# Patient Record
Sex: Male | Born: 1985 | Race: White | Hispanic: No | Marital: Married | Smoking: Never smoker
Health system: Southern US, Community
[De-identification: ages and names within clinical notes are randomized; demographics above are authoritative.]

---

## 2019-12-22 ENCOUNTER — Other Ambulatory Visit: Payer: Self-pay

## 2019-12-22 ENCOUNTER — Encounter: Payer: Self-pay | Admitting: *Deleted

## 2019-12-22 DIAGNOSIS — N200 Calculus of kidney: Secondary | ICD-10-CM | POA: Insufficient documentation

## 2019-12-22 DIAGNOSIS — N23 Unspecified renal colic: Secondary | ICD-10-CM | POA: Diagnosis not present

## 2019-12-22 DIAGNOSIS — R109 Unspecified abdominal pain: Secondary | ICD-10-CM | POA: Diagnosis present

## 2019-12-22 NOTE — ED Triage Notes (Signed)
Pt to ED reporting chronic lower back pain that haw started to worsen over the past thirty minutes. Pt reports the pain is different than normal and is localized to the right flank with dysuria.   No hx of kidney stones.

## 2019-12-23 ENCOUNTER — Emergency Department: Payer: BC Managed Care – PPO

## 2019-12-23 ENCOUNTER — Emergency Department
Admission: EM | Admit: 2019-12-23 | Discharge: 2019-12-23 | Disposition: A | Discharge status: Home / Self Care | Payer: BC Managed Care – PPO | Attending: Emergency Medicine | Admitting: Emergency Medicine

## 2019-12-23 DIAGNOSIS — N200 Calculus of kidney: Secondary | ICD-10-CM

## 2019-12-23 DIAGNOSIS — N23 Unspecified renal colic: Secondary | ICD-10-CM

## 2019-12-23 LAB — CBC WITH DIFFERENTIAL/PLATELET
Abs Immature Granulocytes: 0.1 10*3/uL — ABNORMAL HIGH (ref 0.00–0.07)
Basophils Absolute: 0.1 10*3/uL (ref 0.0–0.1)
Basophils Relative: 0 %
Eosinophils Absolute: 0.1 10*3/uL (ref 0.0–0.5)
Eosinophils Relative: 1 %
HCT: 46.6 % (ref 39.0–52.0)
Hemoglobin: 15.9 g/dL (ref 13.0–17.0)
Immature Granulocytes: 1 %
Lymphocytes Relative: 15 %
Lymphs Abs: 2.4 10*3/uL (ref 0.7–4.0)
MCH: 29.7 pg (ref 26.0–34.0)
MCHC: 34.1 g/dL (ref 30.0–36.0)
MCV: 87.1 fL (ref 80.0–100.0)
Monocytes Absolute: 1.1 10*3/uL — ABNORMAL HIGH (ref 0.1–1.0)
Monocytes Relative: 7 %
Neutro Abs: 12.4 10*3/uL — ABNORMAL HIGH (ref 1.7–7.7)
Neutrophils Relative %: 76 %
Platelets: 311 10*3/uL (ref 150–400)
RBC: 5.35 MIL/uL (ref 4.22–5.81)
RDW: 11.9 % (ref 11.5–15.5)
WBC: 16.2 10*3/uL — ABNORMAL HIGH (ref 4.0–10.5)
nRBC: 0 % (ref 0.0–0.2)

## 2019-12-23 LAB — URINALYSIS, COMPLETE (UACMP) WITH MICROSCOPIC
Bacteria, UA: NONE SEEN
Bilirubin Urine: NEGATIVE
Glucose, UA: NEGATIVE mg/dL
Ketones, ur: NEGATIVE mg/dL
Leukocytes,Ua: NEGATIVE
Nitrite: NEGATIVE
Protein, ur: 30 mg/dL — AB
RBC / HPF: >50 RBC/hpf — ABNORMAL HIGH (ref 0–5)
Specific Gravity, Urine: 1.024 (ref 1.005–1.030)
pH: 5 (ref 5.0–8.0)

## 2019-12-23 LAB — LIPASE, BLOOD: Lipase: 40 U/L (ref 11–51)

## 2019-12-23 LAB — COMPREHENSIVE METABOLIC PANEL
ALT: 33 U/L (ref 0–44)
AST: 26 U/L (ref 15–41)
Albumin: 4.3 g/dL (ref 3.5–5.0)
Alkaline Phosphatase: 66 U/L (ref 38–126)
Anion gap: 8 (ref 5–15)
BUN: 19 mg/dL (ref 6–20)
CO2: 25 mmol/L (ref 22–32)
Calcium: 9.2 mg/dL (ref 8.9–10.3)
Chloride: 105 mmol/L (ref 98–111)
Creatinine, Ser: 0.81 mg/dL (ref 0.61–1.24)
GFR calc Af Amer: >60 mL/min (ref >60)
GFR calc non Af Amer: >60 mL/min (ref >60)
Glucose, Bld: 102 mg/dL — ABNORMAL HIGH (ref 70–99)
Potassium: 4.8 mmol/L (ref 3.5–5.1)
Sodium: 138 mmol/L (ref 135–145)
Total Bilirubin: 1.4 mg/dL — ABNORMAL HIGH (ref 0.3–1.2)
Total Protein: 7.5 g/dL (ref 6.5–8.1)

## 2019-12-23 MED ORDER — ONDANSETRON HCL 4 MG/2ML IJ SOLN
4.0000 mg | Freq: Once | INTRAMUSCULAR | Status: AC
  Administered 2019-12-23: 02:00:00 4 mg via INTRAVENOUS
  Filled 2019-12-23: qty 2

## 2019-12-23 MED ORDER — KETOROLAC TROMETHAMINE 30 MG/ML IJ SOLN
15.0000 mg | Freq: Once | INTRAMUSCULAR | Status: AC
  Administered 2019-12-23: 04:00:00 15 mg via INTRAVENOUS
  Filled 2019-12-23: qty 1

## 2019-12-23 MED ORDER — SODIUM CHLORIDE 0.9 % IV BOLUS: 1000.0000 mL | Freq: Once | INTRAVENOUS | Status: DC

## 2019-12-23 NOTE — ED Provider Notes (Signed)
[facility] Emergency Department Provider Note  ____________________________________________  Time seen: Approximately 1:49 AM  I have reviewed the triage vital signs and the nursing notes.   HISTORY  Chief Complaint Flank Pain   HPI Manuel Howard is a 34 y.o. male with a history of ankylosing spondylitis, sacroiliitis, wedge compression fracture of T7 and T8 who presents for evaluation of flank pain.  Patient reports that he started having a flare of his AS a couple of days ago.  Has been taking his daily meloxicam as prescribed.   This evening around 9 PM started having some urinary urgency and dysuria.  About an hour later he started having right-sided flank pain.  Initially dull but then it became severe and sharp radiating to the right groin and abdominal area associated with nausea.  The pain was severe.  Patient reports the pain is very different from his exacerbations of AS.  At this time he reports that the pain has significantly improved but still present mildly.  He reports that he might have seen some blood in his urine.  He denies prior history of kidney stones.  He denies testicular pain or swelling.  He denies any prior abdominal surgeries.  No diarrhea or constipation, no chest pain or shortness of breath.  PMH Ankylosing spondylitis Sacroiliitis Compression fracture T7-T8  Allergies Diclofenac and Amoxicillin  FH Father -ankylosing spondylitis and kidney stones  Social History Social History   Tobacco Use  . Smoking status: Never Smoker  . Smokeless tobacco: Never Used  Substance Use Topics  . Alcohol use: Never  . Drug use: Never    Review of Systems  Constitutional: Negative for fever. Eyes: Negative for visual changes. ENT: Negative for sore throat. Neck: No neck pain  Cardiovascular: Negative for chest pain. Respiratory: Negative for shortness of breath. Gastrointestinal: Negative for abdominal pain, vomiting or  diarrhea. Genitourinary: + dysuria, frequency, hematuria, and R flank pain Musculoskeletal: Negative for back pain. Skin: Negative for rash. Neurological: Negative for headaches, weakness or numbness. Psych: No SI or HI  ____________________________________________   PHYSICAL EXAM:  VITAL SIGNS: ED Triage Vitals  Enc Vitals Group     BP 12/22/19 2347 (!) 147/103     Pulse Rate 12/22/19 2347 96     Resp 12/22/19 2347 16     Temp 12/22/19 2347 97.9 F (36.6 C)     Temp Source 12/22/19 2347 Oral     SpO2 12/22/19 2347 98 %     Weight 12/22/19 2348 239 lb (108.4 kg)     Height 12/22/19 2348 5\' 10"  (1.778 m)     Head Circumference --      Peak Flow --      Pain Score 12/22/19 2347 8     Pain Loc --      Pain Edu? --      Excl. in GC? --     Constitutional: Alert and oriented. Well appearing and in no apparent distress. HEENT:      Head: Normocephalic and atraumatic.         Eyes: Conjunctivae are normal. Sclera is non-icteric.       Mouth/Throat: Mucous membranes are moist.       Neck: Supple with no signs of meningismus. Cardiovascular: Regular rate and rhythm. No murmurs, gallops, or rubs.  Respiratory: Normal respiratory effort. Lungs are clear to auscultation bilaterally. Gastrointestinal: Soft, non tender, and non distended with positive bowel sounds. No rebound or guarding. Genitourinary: No CVA tenderness. Bilateral  testicles are descended with no tenderness to palpation, bilateral positive cremasteric reflexes are present, no swelling or erythema of the scrotum. No evidence of inguinal hernia. Musculoskeletal: No edema, cyanosis, or erythema of extremities. Neurologic: Normal speech and language. Face is symmetric. Moving all extremities. No gross focal neurologic deficits are appreciated. Skin: Skin is warm, dry and intact. No rash noted. Psychiatric: Mood and affect are normal. Speech and behavior are normal.  ____________________________________________    LABS (all labs ordered are listed, but only abnormal results are displayed)  Labs Reviewed  CBC WITH DIFFERENTIAL/PLATELET - Abnormal; Notable for the following components:      Result Value   WBC 16.2 (*)    Neutro Abs 12.4 (*)    Monocytes Absolute 1.1 (*)    Abs Immature Granulocytes 0.10 (*)    All other components within normal limits  COMPREHENSIVE METABOLIC PANEL - Abnormal; Notable for the following components:   Glucose, Bld 102 (*)    Total Bilirubin 1.4 (*)    All other components within normal limits  URINALYSIS, COMPLETE (UACMP) WITH MICROSCOPIC - Abnormal; Notable for the following components:   Color, Urine YELLOW (*)    APPearance HAZY (*)    Hgb urine dipstick LARGE (*)    Protein, ur 30 (*)    RBC / HPF >50 (*)    All other components within normal limits  LIPASE, BLOOD   ____________________________________________  EKG  none  ____________________________________________  RADIOLOGY  I have personally reviewed the images performed during this visit and I agree with the Radiologist's read.   Interpretation by Radiologist:  CT Renal Stone Study  Result Date: 12/23/2019 CLINICAL DATA:  34 year old male with flank pain. Concern for kidney stone. EXAM: CT ABDOMEN AND PELVIS WITHOUT CONTRAST TECHNIQUE: Multidetector CT imaging of the abdomen and pelvis was performed following the standard protocol without IV contrast. COMPARISON:  None. FINDINGS: Evaluation of this exam is limited in the absence of intravenous contrast. Lower chest: The visualized lung bases are clear. No intra-abdominal free air or free fluid. Hepatobiliary: Diffuse fatty infiltration of the liver. No intrahepatic biliary ductal dilatation. The gallbladder is unremarkable. Pancreas: Unremarkable. No pancreatic ductal dilatation or surrounding inflammatory changes. Spleen: Normal in size without focal abnormality. Adrenals/Urinary Tract: The adrenal glands are unremarkable. There is a 3 mm  nonobstructing right renal inferior pole calculus. Several punctate nonobstructing left renal calculi also noted. There is no hydronephrosis on either side. Stomach/Bowel: There is no bowel obstruction or active inflammation. The appendix is normal. Vascular/Lymphatic: The aorta and IVC are grossly unremarkable on this noncontrast CT. No portal venous gas. There is no adenopathy. There is mild haziness of the mesentery with multiple top-normal lymph nodes with a "misty mesentery" appearance. This finding is nonspecific but may be related to underlying inflammatory/infectious etiology. Reproductive: The prostate and seminal vesicles are grossly unremarkable. No pelvic mass. Other: None Musculoskeletal: No acute or significant osseous findings. IMPRESSION: 1. Small nonobstructing bilateral renal calculi. No hydronephrosis. 2. Fatty liver. 3. No bowel obstruction. Normal appendix. Electronically Signed   By: Elgie Collard M.D.   On: 12/23/2019 02:54      ____________________________________________   PROCEDURES  Procedure(s) performed: None Procedures Critical Care performed:  None ____________________________________________   INITIAL IMPRESSION / ASSESSMENT AND PLAN / ED COURSE   34 y.o. male with a history of ankylosing spondylitis, sacroiliitis, wedge compression fracture of T7 and T8 who presents for evaluation of flank pain, frequency, dysuria, hematuria.  Patient is well-appearing in no distress  with normal vital signs, no flank tenderness, abdominal exam shows no tenderness, no distention, GU exam is normal.  Differential diagnoses including kidney stones versus pyelonephritis versus UTI versus STD versus AS flair versus appendicitis versus GB disease.  Plan for CBC, CMP, UA, CT renal.  _________________________ 5:03 AM on 12/23/2019 -----------------------------------------  CT showing no obstructing stone at this time but bilateral renal calculi, UA is positive for hematuria with  no evidence of urinary tract infection.  These findings are mostly consistent with a recent past stone.  Discussed dietary changes and increase p.o. hydration to prevent any further kidney stones.  Labs showing leukocytosis with white count of 16.2 most likely stress-induced.  CMP with no significant electrolyte abnormalities.  Patient is stable for discharge home.  Recommended continuing meloxicam which he takes for his ankylosing spondylitis.  Discussed my standard return precautions and follow-up with his doctor.   As part of my medical decision making, I  have reviewed patient's previous medical records and PMH. Patient received IV toradol for pain.       _____________________________________________ Please note:  Patient was evaluated in Emergency Department today for the symptoms described in the history of present illness. Patient was evaluated in the context of the global COVID-19 pandemic, which necessitated consideration that the patient might be at risk for infection with the SARS-CoV-2 virus that causes COVID-19. Institutional protocols and algorithms that pertain to the evaluation of patients at risk for COVID-19 are in a state of rapid change based on information released by regulatory bodies including the CDC and federal and state organizations. These policies and algorithms were followed during the patient's care in the ED.  Some ED evaluations and interventions may be delayed as a result of limited staffing during the pandemic.   ____________________________________________   FINAL CLINICAL IMPRESSION(S) / ED DIAGNOSES   Final diagnoses:  Ureteral colic  Kidney stone      NEW MEDICATIONS STARTED DURING THIS VISIT:  ED Discharge Orders    None       Note:  This document was prepared using Dragon voice recognition software and may include unintentional dictation errors.    Alfred Levins, [place], MD 12/23/19 [phone]

## 2020-08-30 IMAGING — CT CT RENAL STONE PROTOCOL
2 of 4 series · 16 of 46 positions shown, 18 images · non-contrast
Comparison: None.

CLINICAL DATA: 33-year-old male with flank pain. Concern for kidney
stone.

EXAM:
CT ABDOMEN AND PELVIS WITHOUT CONTRAST
TECHNIQUE: Multidetector CT imaging of the abdomen and pelvis was performed
following the standard protocol without IV contrast.

[Series 2: stone full standard · axial · 0.80mm/px · z∈[-914,-424]mm · 13 of 108 slices shown, 15 images]
[im 5/108  soft-tissue]
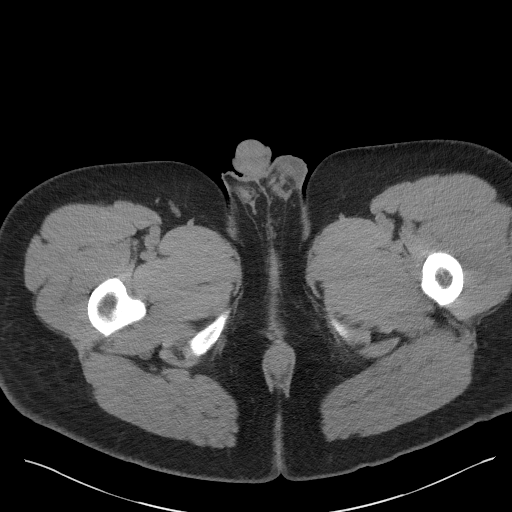
[im 5/108  bone]
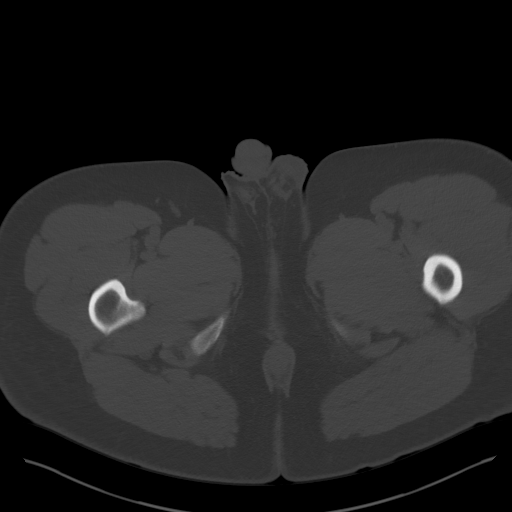
[im 13/108  soft-tissue]
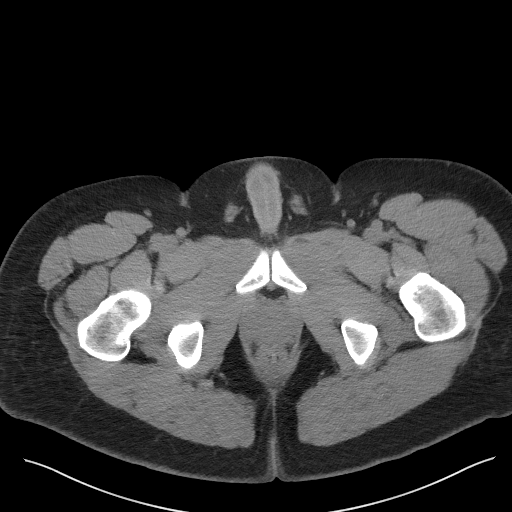
[im 22/108  soft-tissue]
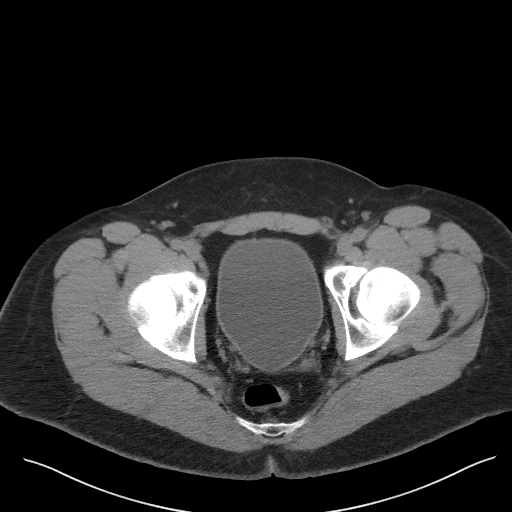
[im 30/108  soft-tissue]
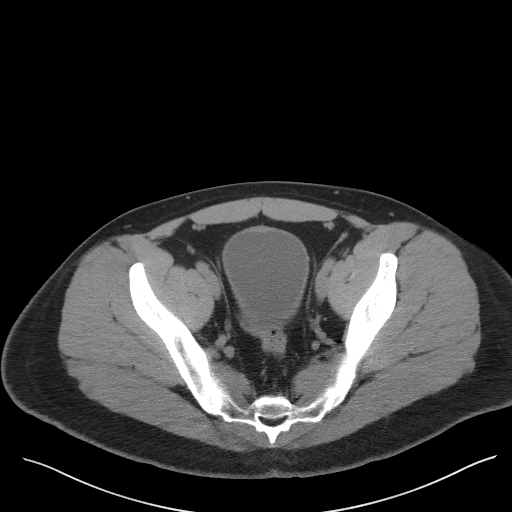
[im 39/108  soft-tissue]
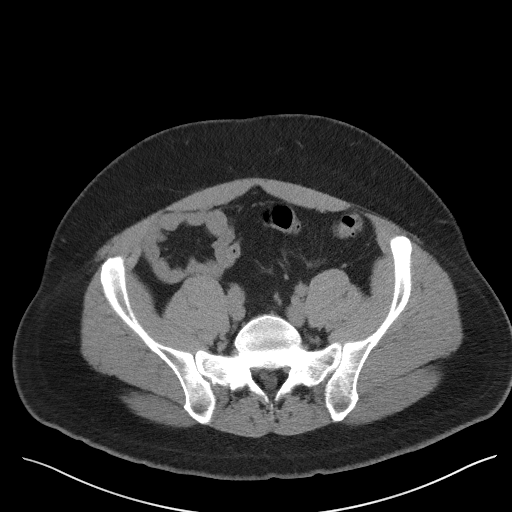
[im 48/108  soft-tissue]
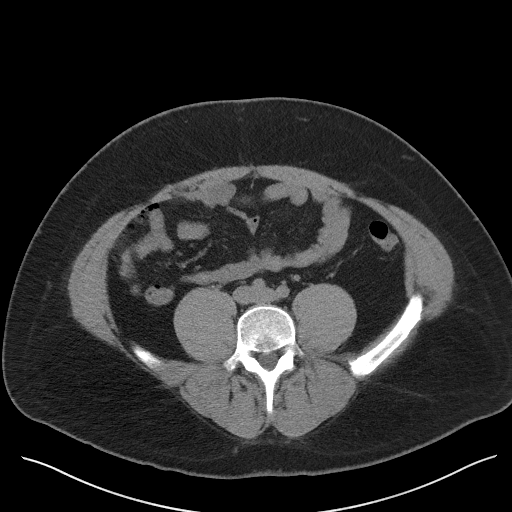
[im 56/108  soft-tissue]
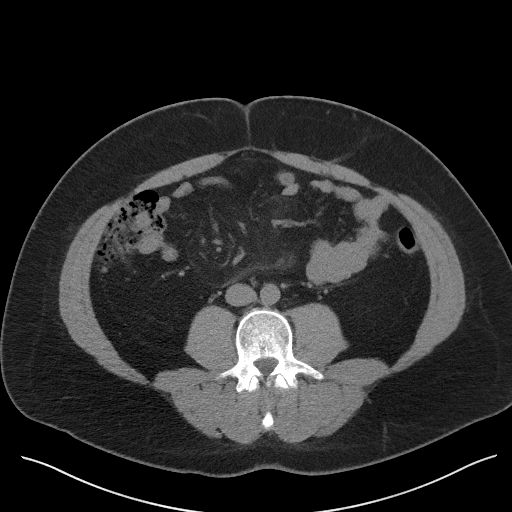
[im 60/108  soft-tissue]
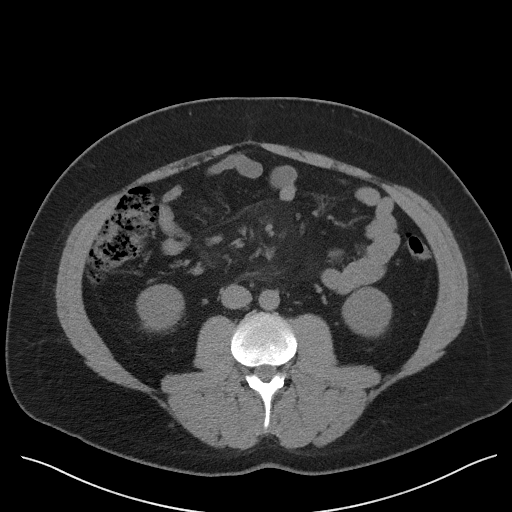
[im 69/108  soft-tissue]
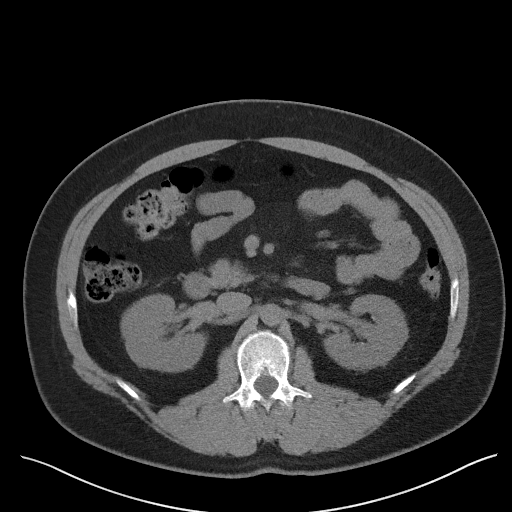
[im 69/108  bone]
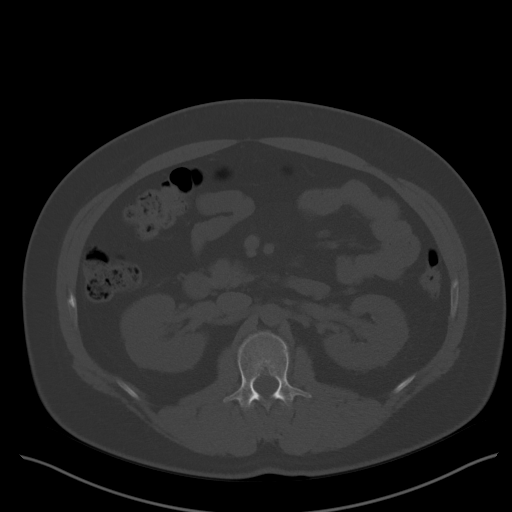
[im 78/108  soft-tissue]
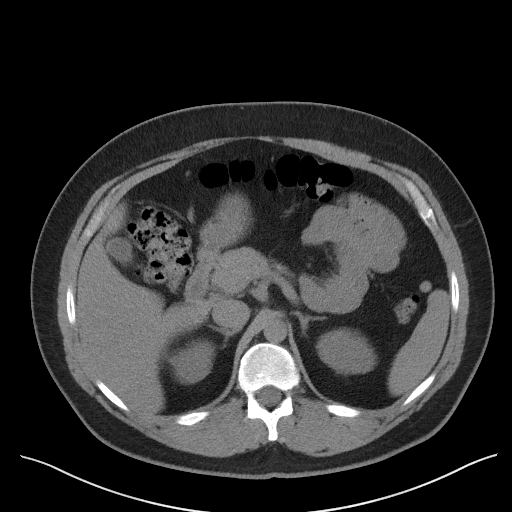
[im 86/108  soft-tissue]
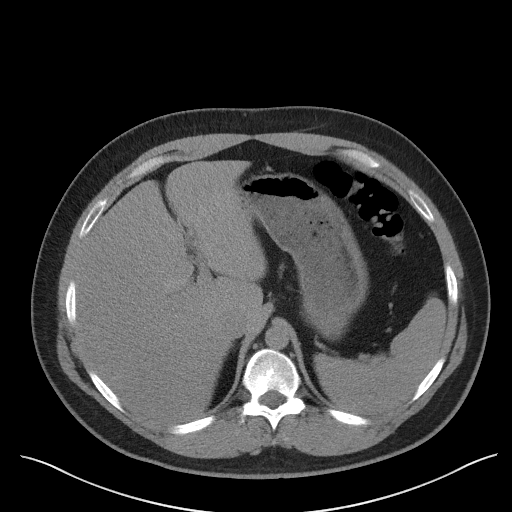
[im 95/108  soft-tissue]
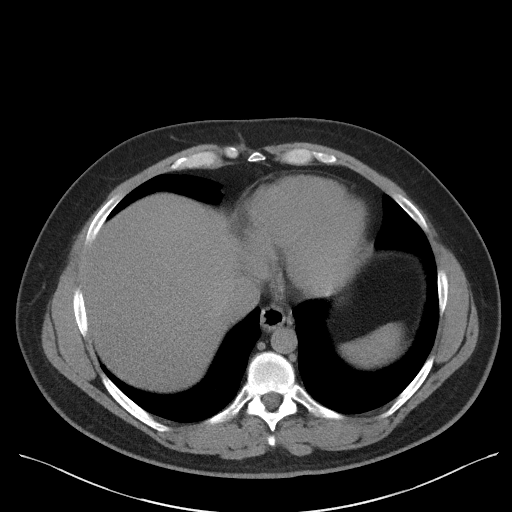
[im 103/108  soft-tissue]
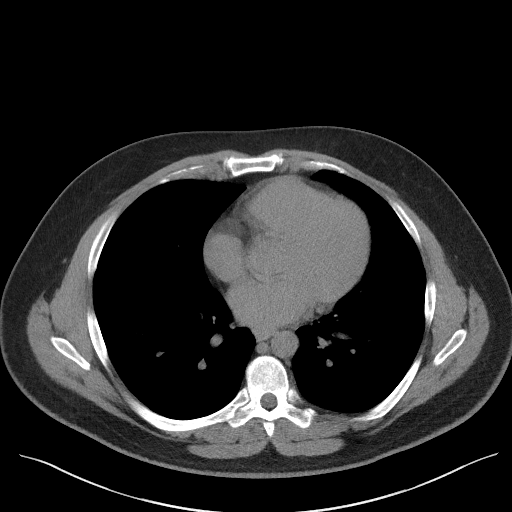

[Series 5: coronal · coronal · 0.85mm/px · 3 of 138 slices shown]
[im 46/138  soft-tissue]
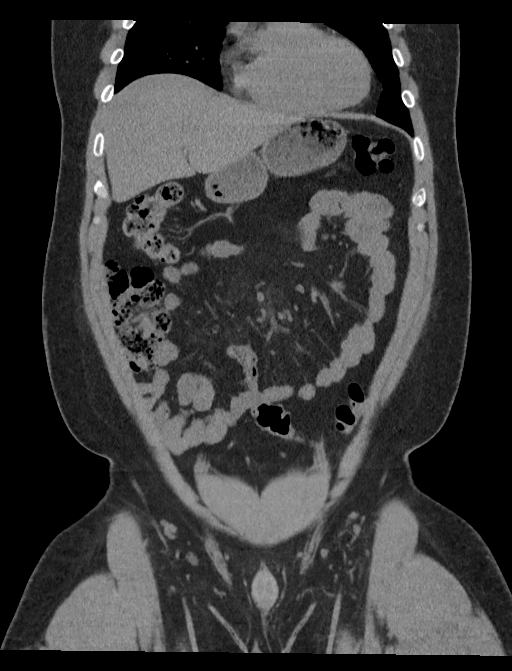
[im 61/138  soft-tissue]
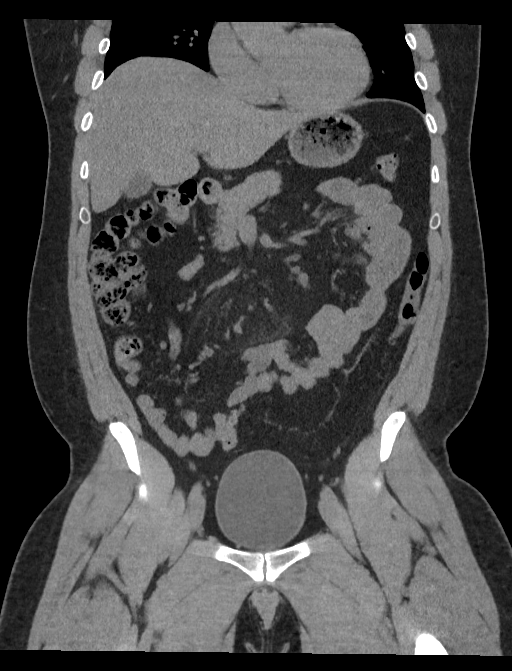
[im 77/138  soft-tissue]
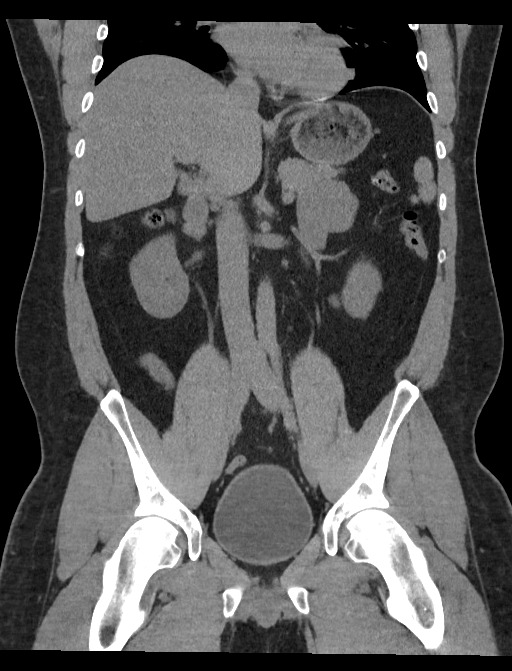

[16 of 46 positions shown; findings below may reference images not displayed]

FINDINGS: Evaluation of this exam is limited in the absence of intravenous
contrast.

Lower chest: The visualized lung bases are clear.

No intra-abdominal free air or free fluid.

Hepatobiliary: Diffuse fatty infiltration of the liver. No
intrahepatic biliary ductal dilatation. The gallbladder is
unremarkable.

Pancreas: Unremarkable. No pancreatic ductal dilatation or
surrounding inflammatory changes.

Spleen: Normal in size without focal abnormality.

Adrenals/Urinary Tract: The adrenal glands are unremarkable. There
is a 3 mm nonobstructing right renal inferior pole calculus. Several
punctate nonobstructing left renal calculi also noted. There is no
hydronephrosis on either side.

Stomach/Bowel: There is no bowel obstruction or active inflammation.
The appendix is normal.

Vascular/Lymphatic: The aorta and IVC are grossly unremarkable on
this noncontrast CT. No portal venous gas. There is no adenopathy.
There is mild haziness of the mesentery with multiple top-normal
lymph nodes with a "misty mesentery" appearance. This finding is
nonspecific but may be related to underlying inflammatory/infectious
etiology.

Reproductive: The prostate and seminal vesicles are grossly
unremarkable. No pelvic mass.

Other: None

Musculoskeletal: No acute or significant osseous findings.
IMPRESSION: 1. Small nonobstructing bilateral renal calculi. No hydronephrosis.
2. Fatty liver.
3. No bowel obstruction. Normal appendix.
